# Patient Record
Sex: Female | Born: 1935 | Race: White | Hispanic: No
Health system: Southern US, Community
[De-identification: ages and names within clinical notes are randomized; demographics above are authoritative.]

---

## 2016-03-09 ENCOUNTER — Other Ambulatory Visit (HOSPITAL_COMMUNITY): Payer: Self-pay

## 2016-03-09 ENCOUNTER — Inpatient Hospital Stay
Admission: AD | Admit: 2016-03-09 | Discharge: 2016-03-30 | Disposition: A | Discharge status: Rehabilitation Facility | Payer: Self-pay | Source: Ambulatory Visit | Attending: Internal Medicine | Admitting: Internal Medicine

## 2016-03-09 DIAGNOSIS — W19XXXA Unspecified fall, initial encounter: Secondary | ICD-10-CM

## 2016-03-09 DIAGNOSIS — J9690 Respiratory failure, unspecified, unspecified whether with hypoxia or hypercapnia: Secondary | ICD-10-CM

## 2016-03-09 DIAGNOSIS — Z0189 Encounter for other specified special examinations: Secondary | ICD-10-CM

## 2016-03-09 DIAGNOSIS — Z4659 Encounter for fitting and adjustment of other gastrointestinal appliance and device: Secondary | ICD-10-CM

## 2016-03-09 LAB — C DIFFICILE QUICK SCREEN W PCR REFLEX
C DIFFICILE (CDIFF) INTERP: NOT DETECTED
C DIFFICILE (CDIFF) TOXIN: NEGATIVE
C DIFFICLE (CDIFF) ANTIGEN: NEGATIVE

## 2016-03-10 ENCOUNTER — Other Ambulatory Visit (HOSPITAL_COMMUNITY): Payer: Self-pay

## 2016-03-10 LAB — CBC WITH DIFFERENTIAL/PLATELET
Basophils Absolute: 0 10*3/uL (ref 0.0–0.1)
Basophils Relative: 0 %
EOS PCT: 3 %
Eosinophils Absolute: 0.3 10*3/uL (ref 0.0–0.7)
HCT: 40.4 % (ref 36.0–46.0)
Hemoglobin: 13.1 g/dL (ref 12.0–15.0)
LYMPHS PCT: 13 %
Lymphs Abs: 1.3 10*3/uL (ref 0.7–4.0)
MCH: 32 pg (ref 26.0–34.0)
MCHC: 32.4 g/dL (ref 30.0–36.0)
MCV: 98.8 fL (ref 78.0–100.0)
MONO ABS: 0.8 10*3/uL (ref 0.1–1.0)
Monocytes Relative: 8 %
Neutro Abs: 7.7 10*3/uL (ref 1.7–7.7)
Neutrophils Relative %: 76 %
PLATELETS: 294 10*3/uL (ref 150–400)
RBC: 4.09 MIL/uL (ref 3.87–5.11)
RDW: 16.8 % — AB (ref 11.5–15.5)
WBC: 10.1 10*3/uL (ref 4.0–10.5)

## 2016-03-10 LAB — COMPREHENSIVE METABOLIC PANEL
ALBUMIN: 2.4 g/dL — AB (ref 3.5–5.0)
ALT: 45 U/L (ref 14–54)
AST: 36 U/L (ref 15–41)
Alkaline Phosphatase: 80 U/L (ref 38–126)
Anion gap: 7 (ref 5–15)
BUN: 26 mg/dL — AB (ref 6–20)
CHLORIDE: 107 mmol/L (ref 101–111)
CO2: 30 mmol/L (ref 22–32)
Calcium: 9.3 mg/dL (ref 8.9–10.3)
Creatinine, Ser: 0.74 mg/dL (ref 0.44–1.00)
GFR calc Af Amer: >60 mL/min (ref >60)
GFR calc non Af Amer: >60 mL/min (ref >60)
GLUCOSE: 87 mg/dL (ref 65–99)
POTASSIUM: 3.7 mmol/L (ref 3.5–5.1)
Sodium: 144 mmol/L (ref 135–145)
Total Bilirubin: 0.5 mg/dL (ref 0.3–1.2)
Total Protein: 6 g/dL — ABNORMAL LOW (ref 6.5–8.1)

## 2016-03-10 LAB — PROTIME-INR
INR: 1.02
Prothrombin Time: 13.4 seconds (ref 11.4–15.2)

## 2016-03-11 LAB — URINALYSIS, ROUTINE W REFLEX MICROSCOPIC
Bacteria, UA: NONE SEEN
Bilirubin Urine: NEGATIVE
GLUCOSE, UA: NEGATIVE mg/dL
HGB URINE DIPSTICK: NEGATIVE
Ketones, ur: NEGATIVE mg/dL
Leukocytes, UA: NEGATIVE
NITRITE: NEGATIVE
PH: 8 (ref 5.0–8.0)
PROTEIN: 30 mg/dL — AB
Specific Gravity, Urine: 1.017 (ref 1.005–1.030)

## 2016-03-11 LAB — BASIC METABOLIC PANEL
Anion gap: 8 (ref 5–15)
BUN: 26 mg/dL — AB (ref 6–20)
CALCIUM: 9.5 mg/dL (ref 8.9–10.3)
CO2: 31 mmol/L (ref 22–32)
CREATININE: 0.76 mg/dL (ref 0.44–1.00)
Chloride: 104 mmol/L (ref 101–111)
GFR calc Af Amer: >60 mL/min (ref >60)
GLUCOSE: 118 mg/dL — AB (ref 65–99)
POTASSIUM: 3.3 mmol/L — AB (ref 3.5–5.1)
SODIUM: 143 mmol/L (ref 135–145)

## 2016-03-11 LAB — VANCOMYCIN, TROUGH: VANCOMYCIN TR: 11 ug/mL — AB (ref 15–20)

## 2016-03-12 LAB — URINE CULTURE: CULTURE: NO GROWTH

## 2016-03-13 LAB — BASIC METABOLIC PANEL
Anion gap: 6 (ref 5–15)
BUN: 27 mg/dL — ABNORMAL HIGH (ref 6–20)
CALCIUM: 9.5 mg/dL (ref 8.9–10.3)
CO2: 31 mmol/L (ref 22–32)
CREATININE: 0.77 mg/dL (ref 0.44–1.00)
Chloride: 108 mmol/L (ref 101–111)
Glucose, Bld: 116 mg/dL — ABNORMAL HIGH (ref 65–99)
Potassium: 3.9 mmol/L (ref 3.5–5.1)
SODIUM: 145 mmol/L (ref 135–145)

## 2016-03-13 LAB — PHOSPHORUS: PHOSPHORUS: 3.9 mg/dL (ref 2.5–4.6)

## 2016-03-13 LAB — MAGNESIUM: MAGNESIUM: 2.5 mg/dL — AB (ref 1.7–2.4)

## 2016-03-14 LAB — CULTURE, BLOOD (ROUTINE X 2)
Culture: NO GROWTH
Culture: NO GROWTH

## 2016-03-21 ENCOUNTER — Other Ambulatory Visit (HOSPITAL_COMMUNITY): Payer: Self-pay

## 2016-03-21 ENCOUNTER — Other Ambulatory Visit (HOSPITAL_BASED_OUTPATIENT_CLINIC_OR_DEPARTMENT_OTHER): Payer: Self-pay

## 2016-03-21 DIAGNOSIS — R7881 Bacteremia: Secondary | ICD-10-CM

## 2016-03-21 LAB — BASIC METABOLIC PANEL
ANION GAP: 14 (ref 5–15)
BUN: 17 mg/dL (ref 6–20)
CALCIUM: 9.4 mg/dL (ref 8.9–10.3)
CO2: 25 mmol/L (ref 22–32)
Chloride: 98 mmol/L — ABNORMAL LOW (ref 101–111)
Creatinine, Ser: 0.8 mg/dL (ref 0.44–1.00)
GFR calc non Af Amer: >60 mL/min (ref >60)
GLUCOSE: 111 mg/dL — AB (ref 65–99)
POTASSIUM: 4.4 mmol/L (ref 3.5–5.1)
Sodium: 137 mmol/L (ref 135–145)

## 2016-03-21 LAB — CBC WITH DIFFERENTIAL/PLATELET
Basophils Absolute: 0 10*3/uL (ref 0.0–0.1)
Basophils Relative: 0 %
EOS ABS: 0.4 10*3/uL (ref 0.0–0.7)
Eosinophils Relative: 5 %
HEMATOCRIT: 40.1 % (ref 36.0–46.0)
HEMOGLOBIN: 13.4 g/dL (ref 12.0–15.0)
LYMPHS ABS: 1 10*3/uL (ref 0.7–4.0)
LYMPHS PCT: 13 %
MCH: 31.5 pg (ref 26.0–34.0)
MCHC: 33.4 g/dL (ref 30.0–36.0)
MCV: 94.1 fL (ref 78.0–100.0)
MONOS PCT: 10 %
Monocytes Absolute: 0.8 10*3/uL (ref 0.1–1.0)
NEUTROS PCT: 72 %
Neutro Abs: 5.6 10*3/uL (ref 1.7–7.7)
Platelets: 135 10*3/uL — ABNORMAL LOW (ref 150–400)
RBC: 4.26 MIL/uL (ref 3.87–5.11)
RDW: 15.8 % — AB (ref 11.5–15.5)
WBC: 7.8 10*3/uL (ref 4.0–10.5)

## 2016-03-21 LAB — MAGNESIUM: Magnesium: 2.3 mg/dL (ref 1.7–2.4)

## 2016-03-21 NOTE — Progress Notes (Signed)
  Echocardiogram 2D Echocardiogram has been performed.  Leta JunglingCooper, Adaiah Morken M 03/21/2016, 2:06 PM

## 2016-03-23 ENCOUNTER — Other Ambulatory Visit (HOSPITAL_COMMUNITY): Payer: Self-pay

## 2016-03-24 ENCOUNTER — Other Ambulatory Visit (HOSPITAL_COMMUNITY): Payer: Self-pay

## 2016-03-24 LAB — URINALYSIS, ROUTINE W REFLEX MICROSCOPIC
BACTERIA UA: NONE SEEN
Bilirubin Urine: NEGATIVE
GLUCOSE, UA: NEGATIVE mg/dL
HGB URINE DIPSTICK: NEGATIVE
Ketones, ur: NEGATIVE mg/dL
NITRITE: NEGATIVE
PH: 7 (ref 5.0–8.0)
Protein, ur: NEGATIVE mg/dL
SPECIFIC GRAVITY, URINE: 1.016 (ref 1.005–1.030)

## 2016-03-25 LAB — BASIC METABOLIC PANEL
ANION GAP: 10 (ref 5–15)
BUN: 23 mg/dL — ABNORMAL HIGH (ref 6–20)
CALCIUM: 9 mg/dL (ref 8.9–10.3)
CO2: 29 mmol/L (ref 22–32)
Chloride: 97 mmol/L — ABNORMAL LOW (ref 101–111)
Creatinine, Ser: 0.91 mg/dL (ref 0.44–1.00)
GFR, EST NON AFRICAN AMERICAN: 59 mL/min — AB (ref >60)
Glucose, Bld: 91 mg/dL (ref 65–99)
POTASSIUM: 3.8 mmol/L (ref 3.5–5.1)
SODIUM: 136 mmol/L (ref 135–145)

## 2016-03-25 LAB — CBC WITH DIFFERENTIAL/PLATELET
BASOS ABS: 0 10*3/uL (ref 0.0–0.1)
BASOS PCT: 0 %
EOS ABS: 0.3 10*3/uL (ref 0.0–0.7)
EOS PCT: 5 %
HCT: 39 % (ref 36.0–46.0)
Hemoglobin: 12.8 g/dL (ref 12.0–15.0)
LYMPHS PCT: 14 %
Lymphs Abs: 1 10*3/uL (ref 0.7–4.0)
MCH: 31.3 pg (ref 26.0–34.0)
MCHC: 32.8 g/dL (ref 30.0–36.0)
MCV: 95.4 fL (ref 78.0–100.0)
MONO ABS: 0.7 10*3/uL (ref 0.1–1.0)
Monocytes Relative: 10 %
Neutro Abs: 5.1 10*3/uL (ref 1.7–7.7)
Neutrophils Relative %: 71 %
PLATELETS: 162 10*3/uL (ref 150–400)
RBC: 4.09 MIL/uL (ref 3.87–5.11)
RDW: 15.8 % — AB (ref 11.5–15.5)
WBC: 7.2 10*3/uL (ref 4.0–10.5)

## 2016-03-25 LAB — URINE CULTURE: Culture: NO GROWTH

## 2016-03-25 LAB — MAGNESIUM: MAGNESIUM: 2.4 mg/dL (ref 1.7–2.4)

## 2016-03-25 LAB — PHOSPHORUS: Phosphorus: 3.6 mg/dL (ref 2.5–4.6)

## 2016-03-27 ENCOUNTER — Other Ambulatory Visit: Payer: Self-pay

## 2016-03-28 LAB — BASIC METABOLIC PANEL
ANION GAP: 11 (ref 5–15)
BUN: 15 mg/dL (ref 6–20)
CO2: 27 mmol/L (ref 22–32)
CREATININE: 0.87 mg/dL (ref 0.44–1.00)
Calcium: 9.5 mg/dL (ref 8.9–10.3)
Chloride: 99 mmol/L — ABNORMAL LOW (ref 101–111)
GFR calc Af Amer: >60 mL/min (ref >60)
GFR calc non Af Amer: >60 mL/min (ref >60)
GLUCOSE: 96 mg/dL (ref 65–99)
Potassium: 3.8 mmol/L (ref 3.5–5.1)
Sodium: 137 mmol/L (ref 135–145)

## 2016-03-28 LAB — CBC
HCT: 42.3 % (ref 36.0–46.0)
Hemoglobin: 14.1 g/dL (ref 12.0–15.0)
MCH: 31.6 pg (ref 26.0–34.0)
MCHC: 33.3 g/dL (ref 30.0–36.0)
MCV: 94.8 fL (ref 78.0–100.0)
Platelets: 201 10*3/uL (ref 150–400)
RBC: 4.46 MIL/uL (ref 3.87–5.11)
RDW: 15.5 % (ref 11.5–15.5)
WBC: 6.7 10*3/uL (ref 4.0–10.5)

## 2017-08-21 IMAGING — DX DG ABD PORTABLE 1V
1 series · 1 of 1 positions shown · non-contrast
Comparison: Portable exam 1101 hours compared to 1451 hours

CLINICAL DATA: Repositioning of nasogastric tube

EXAM:
PORTABLE ABDOMEN - 1 VIEW

[abdomen kub]
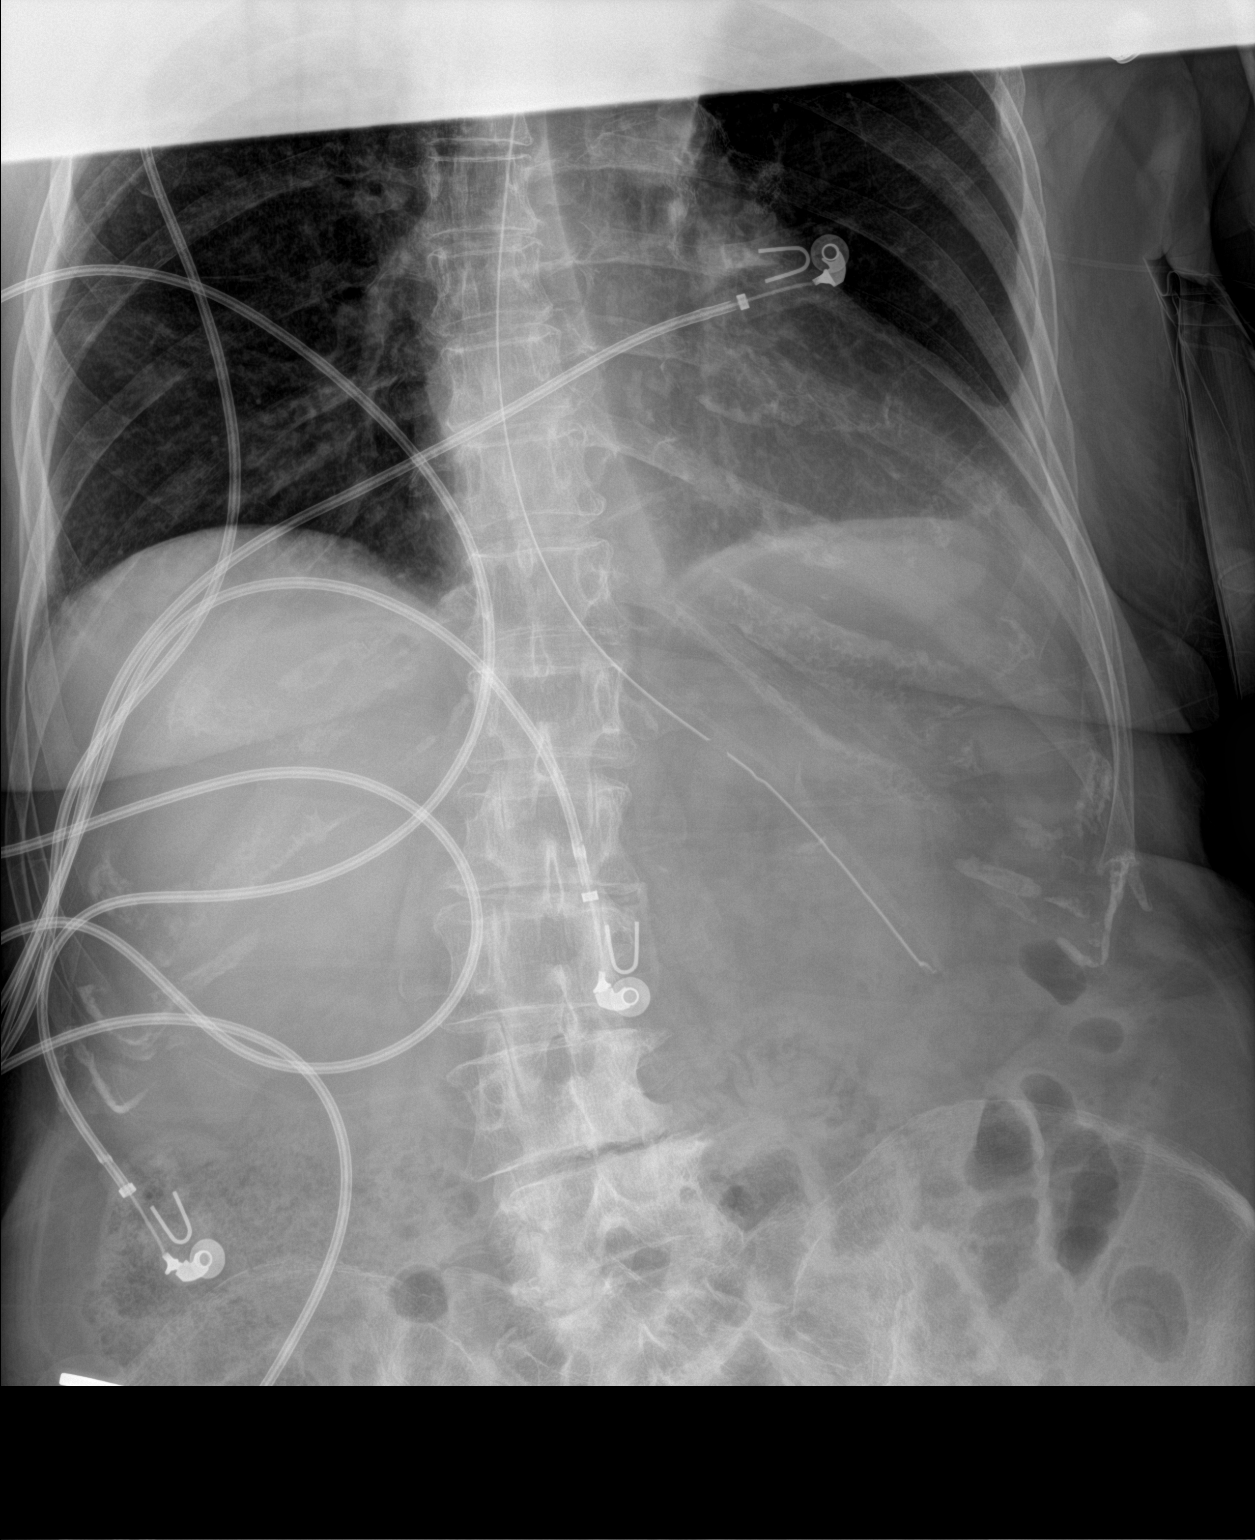

[1 of 1 positions shown; findings below may reference images not displayed]

FINDINGS: Tip of nasogastric tube projects over mid stomach.

Lung bases grossly clear.

Bowel gas pattern normal.

Bones demineralized with degenerative changes of the lumbar spine.
IMPRESSION: Tip of nasogastric tube projects over mid stomach.

Kink in NG tube no longer identified.

## 2017-08-21 IMAGING — DX DG ABD PORTABLE 1V
1 series · 1 of 1 positions shown · non-contrast
Comparison: 03/21/2016.

CLINICAL DATA: NG placement

EXAM:
PORTABLE ABDOMEN - 1 VIEW

[abdomen kub]
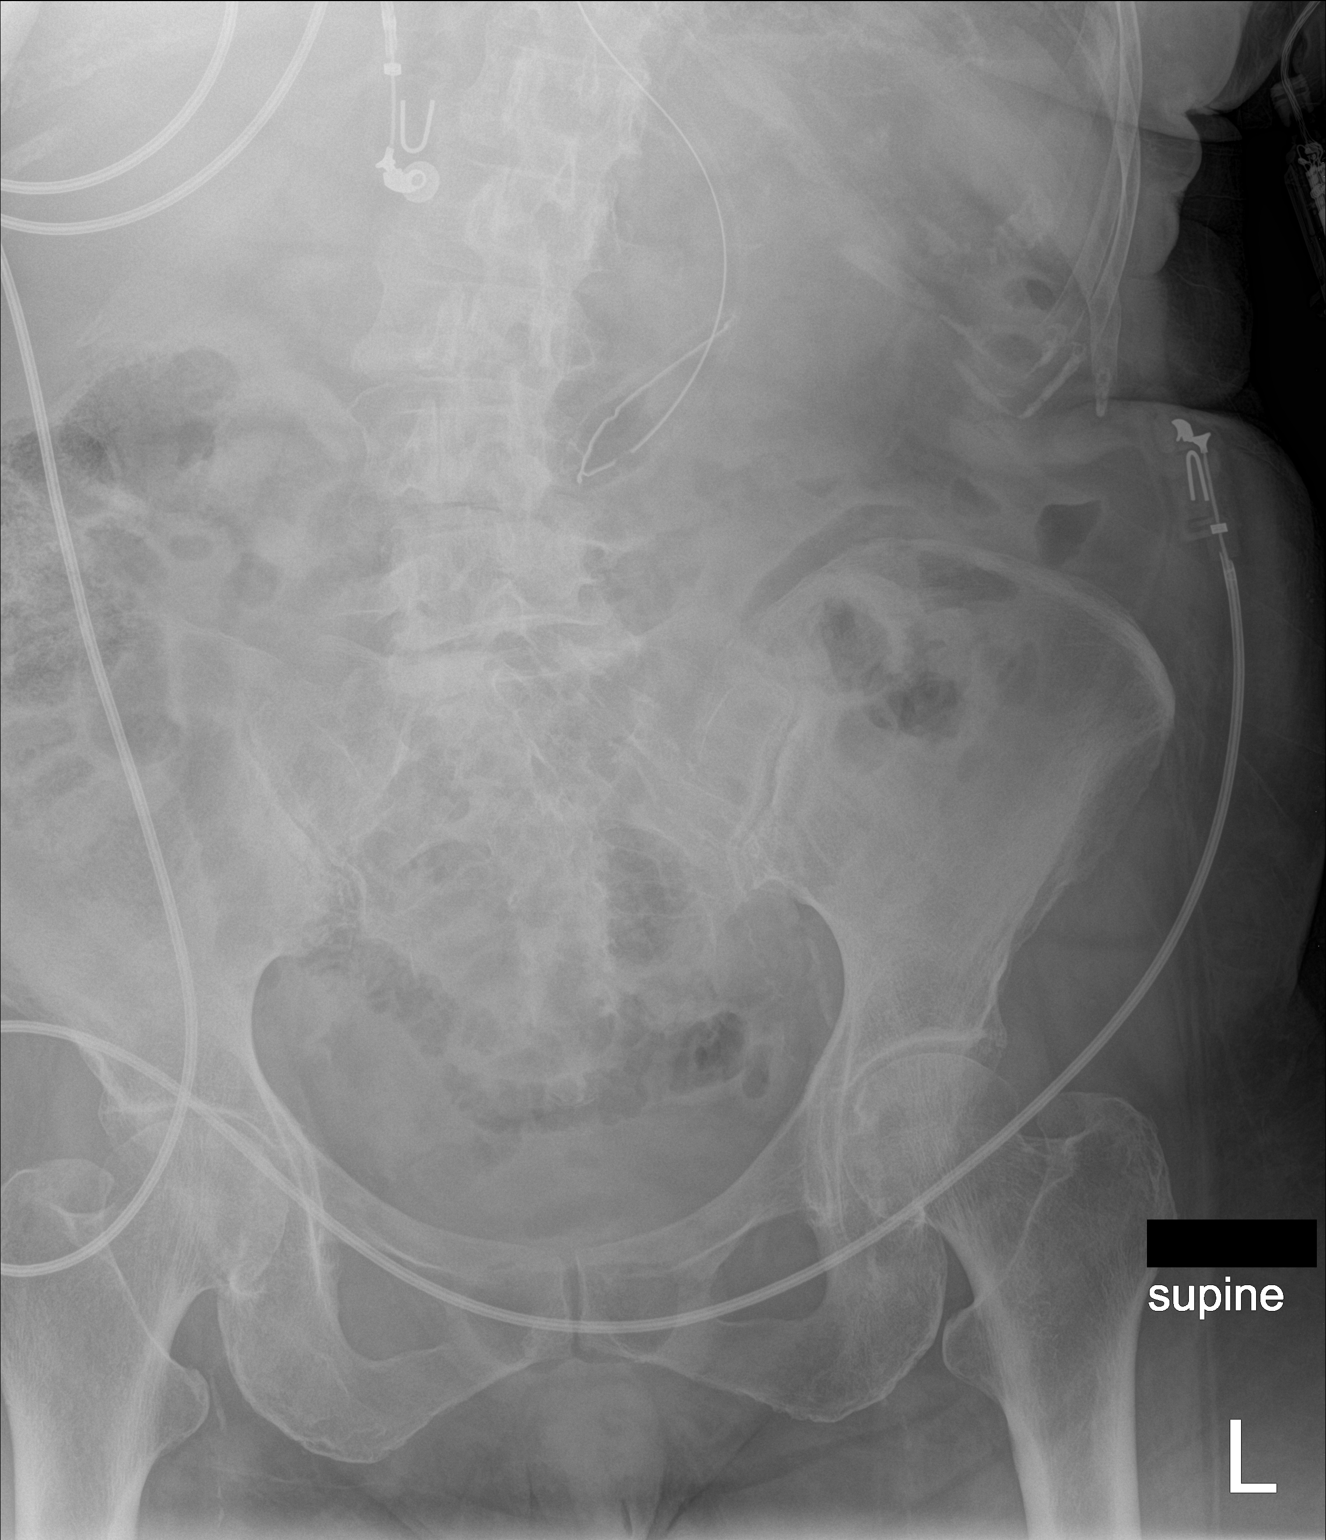

[1 of 1 positions shown; findings below may reference images not displayed]

FINDINGS: Feeding tube is been removed. NG tube is coiled in the stomach.
There is a kink in the tube.

Normal bowel gas pattern
IMPRESSION: NG tube in the body the stomach with a kink in the tube..

## 2020-07-03 DEATH — deceased
